# Patient Record
Sex: Male | Born: 2000 | Hispanic: Yes | Marital: Single | Smoking: Never smoker
Health system: Southern US, Community
[De-identification: ages and names within clinical notes are randomized; demographics above are authoritative.]

## PROBLEM LIST (undated history)

## (undated) HISTORY — PX: COLON SURGERY: SHX602

---
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
## 2019-02-20 ENCOUNTER — Encounter: Payer: Self-pay | Admitting: Emergency Medicine

## 2019-02-20 ENCOUNTER — Emergency Department: Payer: Medicaid Other

## 2019-02-20 ENCOUNTER — Emergency Department
Admission: EM | Admit: 2019-02-20 | Discharge: 2019-02-20 | Disposition: A | Discharge status: Home / Self Care | Payer: Medicaid Other | Attending: Emergency Medicine | Admitting: Emergency Medicine

## 2019-02-20 ENCOUNTER — Other Ambulatory Visit: Payer: Self-pay

## 2019-02-20 DIAGNOSIS — R079 Chest pain, unspecified: Secondary | ICD-10-CM | POA: Diagnosis present

## 2019-02-20 LAB — TROPONIN I (HIGH SENSITIVITY): Troponin I (High Sensitivity): <2 ng/L (ref <18)

## 2019-02-20 LAB — CBC
HCT: 42.9 % (ref 39.0–52.0)
Hemoglobin: 14.1 g/dL (ref 13.0–17.0)
MCH: 26.8 pg (ref 26.0–34.0)
MCHC: 32.9 g/dL (ref 30.0–36.0)
MCV: 81.6 fL (ref 80.0–100.0)
Platelets: 222 10*3/uL (ref 150–400)
RBC: 5.26 MIL/uL (ref 4.22–5.81)
RDW: 12.9 % (ref 11.5–15.5)
WBC: 6.8 10*3/uL (ref 4.0–10.5)
nRBC: 0 % (ref 0.0–0.2)

## 2019-02-20 LAB — BASIC METABOLIC PANEL
Anion gap: 9 (ref 5–15)
BUN: 8 mg/dL (ref 6–20)
CO2: 26 mmol/L (ref 22–32)
Calcium: 9.7 mg/dL (ref 8.9–10.3)
Chloride: 104 mmol/L (ref 98–111)
Creatinine, Ser: 0.47 mg/dL — ABNORMAL LOW (ref 0.61–1.24)
GFR calc Af Amer: >60 mL/min (ref >60)
GFR calc non Af Amer: >60 mL/min (ref >60)
Glucose, Bld: 92 mg/dL (ref 70–99)
Potassium: 4 mmol/L (ref 3.5–5.1)
Sodium: 139 mmol/L (ref 135–145)

## 2019-02-20 MED ORDER — FAMOTIDINE 20 MG PO TABS: 20.0000 mg | ORAL_TABLET | Freq: Every day | ORAL | 1 refills | Status: DC

## 2019-02-20 NOTE — ED Triage Notes (Signed)
Patient reports intermittent chest pain "for a while" that is worse with palpation. Patient denies cough or SOB. States 3 episodes this morning.

## 2019-02-20 NOTE — ED Notes (Signed)
Pt ambulatory back from Xray. NAD noted. NoSOB or increased WOB.

## 2019-02-20 NOTE — ED Notes (Signed)
Patient transported to X-ray 

## 2019-02-20 NOTE — Discharge Instructions (Addendum)
Please seek medical attention for any high fevers, chest pain, shortness of breath, change in behavior, persistent vomiting, bloody stool or any other new or concerning symptoms.  

## 2019-02-20 NOTE — ED Provider Notes (Signed)
[facility] Emergency Department Provider Note   ____________________________________________   I have reviewed the triage vital signs and the nursing notes.   HISTORY  Chief Complaint Chest Pain   History limited by: Not Limited   HPI Jeremiah Moran is a 18 y.o. male who presents to the emergency department today because of concerns for chest pain.  He states that he gets this chest pain roughly once a week.  It has been going on for quite some time. He states he has not noticed any pattern to the pain. He describes it as being located in the left and center part of his chest. Is pressure like. Is accompanied by some slight shortness of breath. He says it will last about an hour and then go away.   Records reviewed. Per medical record review patient has a history of being seen in the emergency department roughly 1 year ago for chest wall contusion.  History reviewed. No pertinent past medical history.  There are no active problems to display for this patient.   History reviewed. No pertinent surgical history.  Prior to Admission medications   Not on File    Allergies Patient has no known allergies.  No family history on file.  Social History Social History   Tobacco Use  . Smoking status: Never Smoker  . Smokeless tobacco: Never Used  Substance Use Topics  . Alcohol use: Never    Frequency: Never  . Drug use: Never    Review of Systems Constitutional: No fever/chills Eyes: No visual changes. ENT: No sore throat. Cardiovascular: Positive for chest pain. Respiratory: Positive for shortness of breath. Gastrointestinal: No abdominal pain.  No nausea, no vomiting.  No diarrhea.   Genitourinary: Negative for dysuria. Musculoskeletal: Negative for back pain. Skin: Negative for rash. Neurological: Negative for headaches, focal weakness or numbness.  ____________________________________________   PHYSICAL EXAM:  VITAL SIGNS: ED  Triage Vitals  Enc Vitals Group     BP 02/20/19 1240 (!) 115/56     Pulse Rate 02/20/19 1240 65     Resp 02/20/19 1240 16     Temp 02/20/19 1240 97.9 F (36.6 C)     Temp Source 02/20/19 1240 Oral     SpO2 02/20/19 1240 100 %     Weight 02/20/19 1239 145 lb (65.8 kg)     Height 02/20/19 1239 5\' 7"  (1.702 m)     Head Circumference --      Peak Flow --      Pain Score 02/20/19 1238 2   Constitutional: Alert and oriented.  Eyes: Conjunctivae are normal.  ENT      Head: Normocephalic and atraumatic.      Nose: No congestion/rhinnorhea.      Mouth/Throat: Mucous membranes are moist.      Neck: No stridor. Hematological/Lymphatic/Immunilogical: No cervical lymphadenopathy. Cardiovascular: Normal rate, regular rhythm.  No murmurs, rubs, or gallops. Respiratory: Normal respiratory effort without tachypnea nor retractions. Breath sounds are clear and equal bilaterally. No wheezes/rales/rhonchi. Gastrointestinal: Soft and non tender. No rebound. No guarding.  Genitourinary: Deferred Musculoskeletal: Normal range of motion in all extremities. No lower extremity edema. Neurologic:  Normal speech and language. No gross focal neurologic deficits are appreciated.  Skin:  Skin is warm, dry and intact. No rash noted. Psychiatric: Mood and affect are normal. Speech and behavior are normal. Patient exhibits appropriate insight and judgment.  ____________________________________________    LABS (pertinent positives/negatives)  Trop hs <2 CBC wbc 6.8, hgb 14.1, plt  222 BMP wnl except cr 0.47  ____________________________________________   EKG  I, Nance Pear, attending physician, personally viewed and interpreted this EKG  EKG Time: 1359 Rate: 48 Rhythm: sinus bradycardia Axis: normal Intervals: qtc 351 QRS: narrow ST changes: j point elevation v2, v5, v6 Impression: abnormal ekg  ____________________________________________    RADIOLOGY  CXR No acute  disease  ____________________________________________   PROCEDURES  Procedures  ____________________________________________   INITIAL IMPRESSION / ASSESSMENT AND PLAN / ED COURSE  Pertinent labs & imaging results that were available during my care of the patient were reviewed by me and considered in my medical decision making (see chart for details).   Patient presented to the emergency department today because of concerns for intermittent chest pain.  He does state that the symptoms have been going on for quite some time and will happen roughly once a week.  Patient's blood work without elevated troponin.  Patient's EKG did show some J-point elevation to I think is secondary to repolarization.  Patient chest x-ray without enlarged heart or concerning findings.  Given intermittent nature of the patient's pain I do wonder if patient suffers from heartburn.  I discussed this with the patient.  Will plan on putting patient on an antacid. ____________________________________________   FINAL CLINICAL IMPRESSION(S) / ED DIAGNOSES  Final diagnoses:  Nonspecific chest pain     Note: This dictation was prepared with Dragon dictation. Any transcriptional errors that result from this process are unintentional     Nance Pear, MD 02/20/19 1437

## 2020-12-26 ENCOUNTER — Inpatient Hospital Stay: Payer: Medicaid Other

## 2020-12-26 ENCOUNTER — Emergency Department: Payer: Medicaid Other

## 2020-12-26 ENCOUNTER — Other Ambulatory Visit: Payer: Self-pay

## 2020-12-26 ENCOUNTER — Encounter: Payer: Self-pay | Admitting: Radiology

## 2020-12-26 ENCOUNTER — Observation Stay
Admission: EM | Admit: 2020-12-26 | Discharge: 2020-12-27 | Disposition: A | Discharge status: Home / Self Care | Payer: Medicaid Other | Attending: Student | Admitting: Student

## 2020-12-26 DIAGNOSIS — K56609 Unspecified intestinal obstruction, unspecified as to partial versus complete obstruction: Principal | ICD-10-CM | POA: Insufficient documentation

## 2020-12-26 DIAGNOSIS — Z0189 Encounter for other specified special examinations: Secondary | ICD-10-CM

## 2020-12-26 DIAGNOSIS — D649 Anemia, unspecified: Secondary | ICD-10-CM | POA: Insufficient documentation

## 2020-12-26 DIAGNOSIS — K5651 Intestinal adhesions [bands], with partial obstruction: Principal | ICD-10-CM | POA: Diagnosis present

## 2020-12-26 DIAGNOSIS — Z20822 Contact with and (suspected) exposure to covid-19: Secondary | ICD-10-CM | POA: Insufficient documentation

## 2020-12-26 DIAGNOSIS — E559 Vitamin D deficiency, unspecified: Secondary | ICD-10-CM | POA: Insufficient documentation

## 2020-12-26 DIAGNOSIS — K56600 Partial intestinal obstruction, unspecified as to cause: Secondary | ICD-10-CM | POA: Diagnosis present

## 2020-12-26 DIAGNOSIS — Z9049 Acquired absence of other specified parts of digestive tract: Secondary | ICD-10-CM

## 2020-12-26 DIAGNOSIS — Z79899 Other long term (current) drug therapy: Secondary | ICD-10-CM | POA: Insufficient documentation

## 2020-12-26 DIAGNOSIS — Z9889 Other specified postprocedural states: Secondary | ICD-10-CM

## 2020-12-26 DIAGNOSIS — R1033 Periumbilical pain: Secondary | ICD-10-CM | POA: Diagnosis present

## 2020-12-26 DIAGNOSIS — W3400XS Accidental discharge from unspecified firearms or gun, sequela: Secondary | ICD-10-CM

## 2020-12-26 DIAGNOSIS — F17290 Nicotine dependence, other tobacco product, uncomplicated: Secondary | ICD-10-CM | POA: Insufficient documentation

## 2020-12-26 DIAGNOSIS — D509 Iron deficiency anemia, unspecified: Secondary | ICD-10-CM | POA: Diagnosis present

## 2020-12-26 DIAGNOSIS — R112 Nausea with vomiting, unspecified: Secondary | ICD-10-CM

## 2020-12-26 DIAGNOSIS — E538 Deficiency of other specified B group vitamins: Secondary | ICD-10-CM | POA: Diagnosis present

## 2020-12-26 LAB — COMPREHENSIVE METABOLIC PANEL
ALT: 19 U/L (ref 0–44)
AST: 23 U/L (ref 15–41)
Albumin: 5 g/dL (ref 3.5–5.0)
Alkaline Phosphatase: 97 U/L (ref 38–126)
Anion gap: 9 (ref 5–15)
BUN: 12 mg/dL (ref 6–20)
CO2: 26 mmol/L (ref 22–32)
Calcium: 9.9 mg/dL (ref 8.9–10.3)
Chloride: 104 mmol/L (ref 98–111)
Creatinine, Ser: 0.62 mg/dL (ref 0.61–1.24)
GFR, Estimated: >60 mL/min (ref >60)
Glucose, Bld: 122 mg/dL — ABNORMAL HIGH (ref 70–99)
Potassium: 3.5 mmol/L (ref 3.5–5.1)
Sodium: 139 mmol/L (ref 135–145)
Total Bilirubin: 0.6 mg/dL (ref 0.3–1.2)
Total Protein: 8.4 g/dL — ABNORMAL HIGH (ref 6.5–8.1)

## 2020-12-26 LAB — LIPASE, BLOOD: Lipase: 24 U/L (ref 11–51)

## 2020-12-26 LAB — URINALYSIS, COMPLETE (UACMP) WITH MICROSCOPIC
Bacteria, UA: NONE SEEN
Bilirubin Urine: NEGATIVE
Glucose, UA: NEGATIVE mg/dL
Hgb urine dipstick: NEGATIVE
Ketones, ur: NEGATIVE mg/dL
Leukocytes,Ua: NEGATIVE
Nitrite: NEGATIVE
Protein, ur: NEGATIVE mg/dL
Specific Gravity, Urine: >1.046 — ABNORMAL HIGH (ref 1.005–1.030)
Squamous Epithelial / HPF: NONE SEEN (ref 0–5)
pH: 6 (ref 5.0–8.0)

## 2020-12-26 LAB — CBC
HCT: 39.6 % (ref 39.0–52.0)
Hemoglobin: 13 g/dL (ref 13.0–17.0)
MCH: 25.4 pg — ABNORMAL LOW (ref 26.0–34.0)
MCHC: 32.8 g/dL (ref 30.0–36.0)
MCV: 77.3 fL — ABNORMAL LOW (ref 80.0–100.0)
Platelets: 287 10*3/uL (ref 150–400)
RBC: 5.12 MIL/uL (ref 4.22–5.81)
RDW: 13.7 % (ref 11.5–15.5)
WBC: 8.1 10*3/uL (ref 4.0–10.5)
nRBC: 0 % (ref 0.0–0.2)

## 2020-12-26 LAB — RESP PANEL BY RT-PCR (FLU A&B, COVID) ARPGX2
Influenza A by PCR: NEGATIVE
Influenza B by PCR: NEGATIVE
SARS Coronavirus 2 by RT PCR: NEGATIVE

## 2020-12-26 MED ORDER — SODIUM CHLORIDE 0.9 % IV SOLN: INTRAVENOUS | Status: DC

## 2020-12-26 MED ORDER — ONDANSETRON 4 MG PO TBDP
ORAL_TABLET | ORAL | Status: AC
  Filled 2020-12-26: qty 1

## 2020-12-26 MED ORDER — PANTOPRAZOLE SODIUM 40 MG IV SOLR
40.0000 mg | INTRAVENOUS | Status: DC
  Administered 2020-12-26 – 2020-12-27 (×2): 40 mg via INTRAVENOUS
  Filled 2020-12-26 (×2): qty 40

## 2020-12-26 MED ORDER — ONDANSETRON HCL 4 MG PO TABS: 4.0000 mg | ORAL_TABLET | Freq: Four times a day (QID) | ORAL | Status: DC | PRN

## 2020-12-26 MED ORDER — IOHEXOL 9 MG/ML PO SOLN
500.0000 mL | Freq: Once | ORAL | Status: AC
  Administered 2020-12-26: 500 mL via ORAL

## 2020-12-26 MED ORDER — LACTATED RINGERS IV BOLUS
1000.0000 mL | Freq: Once | INTRAVENOUS | Status: AC
  Administered 2020-12-26: 1000 mL via INTRAVENOUS

## 2020-12-26 MED ORDER — HYDROMORPHONE HCL 1 MG/ML IJ SOLN
1.0000 mg | INTRAMUSCULAR | Status: DC | PRN
  Administered 2020-12-26 – 2020-12-27 (×3): 1 mg via INTRAVENOUS
  Filled 2020-12-26 (×3): qty 1

## 2020-12-26 MED ORDER — IOHEXOL 350 MG/ML SOLN
75.0000 mL | Freq: Once | INTRAVENOUS | Status: AC | PRN
  Administered 2020-12-26: 75 mL via INTRAVENOUS

## 2020-12-26 MED ORDER — ONDANSETRON HCL 4 MG/2ML IJ SOLN
4.0000 mg | Freq: Once | INTRAMUSCULAR | Status: AC
  Administered 2020-12-26: 4 mg via INTRAVENOUS
  Filled 2020-12-26: qty 2

## 2020-12-26 MED ORDER — ONDANSETRON 4 MG PO TBDP
4.0000 mg | ORAL_TABLET | Freq: Once | ORAL | Status: AC
  Administered 2020-12-26: 4 mg via ORAL

## 2020-12-26 MED ORDER — DIATRIZOATE MEGLUMINE & SODIUM 66-10 % PO SOLN
90.0000 mL | Freq: Once | ORAL | Status: AC
  Administered 2020-12-26: 90 mL via NASOGASTRIC

## 2020-12-26 MED ORDER — ONDANSETRON HCL 4 MG/2ML IJ SOLN: 4.0000 mg | Freq: Four times a day (QID) | INTRAMUSCULAR | Status: DC | PRN

## 2020-12-26 MED ORDER — HYDROMORPHONE HCL 1 MG/ML IJ SOLN
0.5000 mg | Freq: Once | INTRAMUSCULAR | Status: AC
  Administered 2020-12-26: 0.5 mg via INTRAVENOUS
  Filled 2020-12-26: qty 1

## 2020-12-26 MED ORDER — MORPHINE SULFATE (PF) 4 MG/ML IV SOLN
4.0000 mg | Freq: Once | INTRAVENOUS | Status: AC
Start: 2020-12-26 — End: 2020-12-26
  Administered 2020-12-26: 4 mg via INTRAVENOUS
  Filled 2020-12-26: qty 1

## 2020-12-26 MED ORDER — METOCLOPRAMIDE HCL 5 MG/ML IJ SOLN
10.0000 mg | Freq: Once | INTRAMUSCULAR | Status: AC
  Administered 2020-12-26: 10 mg via INTRAVENOUS
  Filled 2020-12-26: qty 2
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
## 2020-12-26 NOTE — Progress Notes (Signed)
Patient resting on stretcher quietly. VSS, denies further needs at this time. NG to low-intermittent suction. Family member at bedside and provided with warm blanket.

## 2020-12-26 NOTE — ED Notes (Signed)
Pt tolerated NG tube well, 16 fr. L nare, 50 @ the nare

## 2020-12-26 NOTE — ED Notes (Signed)
Pt given oral contrast and educated by CT tech on use

## 2020-12-26 NOTE — ED Notes (Signed)
Warm blanket provided.
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
## 2020-12-26 NOTE — ED Notes (Signed)
Pt resting comfortably at this time. Normal rise and fall of chest. NAD noted. Grandmother at bedside. Call bell in reach.   Pt educate don need for UA, pt verbalized understanding.

## 2020-12-26 NOTE — ED Notes (Signed)
Suction started at low intermittent suction
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
## 2020-12-26 NOTE — ED Notes (Signed)
XRAY at bedside confirming NG tube placement
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
## 2020-12-26 NOTE — ED Notes (Signed)
MD requested pt go to CT ASAP, CT called, on and states they are on the way to grab him for his scan, MD aware.

## 2020-12-26 NOTE — ED Notes (Signed)
NG tube advanced from 50cm to 54cm per XRAY confirmation

## 2020-12-26 NOTE — ED Notes (Signed)
Pt transported to CT via stretcher.  

## 2020-12-26 NOTE — ED Notes (Signed)
Informed RN bed assigned 

## 2020-12-26 NOTE — ED Notes (Signed)
NG tube clamped after administering 49ml of gastrografin and 1ml of sterile water.  XRAY made aware of admin time of gastrografin

## 2020-12-27 DIAGNOSIS — K56600 Partial intestinal obstruction, unspecified as to cause: Secondary | ICD-10-CM | POA: Diagnosis not present

## 2020-12-27 LAB — VITAMIN B12: Vitamin B-12: 199 pg/mL (ref 180–914)

## 2020-12-27 LAB — BASIC METABOLIC PANEL
Anion gap: 7 (ref 5–15)
BUN: 10 mg/dL (ref 6–20)
CO2: 28 mmol/L (ref 22–32)
Calcium: 9 mg/dL (ref 8.9–10.3)
Chloride: 106 mmol/L (ref 98–111)
Creatinine, Ser: 0.59 mg/dL — ABNORMAL LOW (ref 0.61–1.24)
GFR, Estimated: >60 mL/min (ref >60)
Glucose, Bld: 86 mg/dL (ref 70–99)
Potassium: 3.4 mmol/L — ABNORMAL LOW (ref 3.5–5.1)
Sodium: 141 mmol/L (ref 135–145)

## 2020-12-27 LAB — HIV ANTIBODY (ROUTINE TESTING W REFLEX): HIV Screen 4th Generation wRfx: NONREACTIVE

## 2020-12-27 LAB — VITAMIN D 25 HYDROXY (VIT D DEFICIENCY, FRACTURES): Vit D, 25-Hydroxy: 21.19 ng/mL — ABNORMAL LOW (ref 30–100)

## 2020-12-27 LAB — IRON AND TIBC
Iron: 27 ug/dL — ABNORMAL LOW (ref 45–182)
Saturation Ratios: 6 % — ABNORMAL LOW (ref 17.9–39.5)
TIBC: 494 ug/dL — ABNORMAL HIGH (ref 250–450)
UIBC: 467 ug/dL

## 2020-12-27 LAB — CBC
HCT: 35.8 % — ABNORMAL LOW (ref 39.0–52.0)
Hemoglobin: 11.6 g/dL — ABNORMAL LOW (ref 13.0–17.0)
MCH: 25.3 pg — ABNORMAL LOW (ref 26.0–34.0)
MCHC: 32.4 g/dL (ref 30.0–36.0)
MCV: 78 fL — ABNORMAL LOW (ref 80.0–100.0)
Platelets: 234 10*3/uL (ref 150–400)
RBC: 4.59 MIL/uL (ref 4.22–5.81)
RDW: 13.9 % (ref 11.5–15.5)
WBC: 6.7 10*3/uL (ref 4.0–10.5)
nRBC: 0 % (ref 0.0–0.2)

## 2020-12-27 LAB — MAGNESIUM: Magnesium: 2.1 mg/dL (ref 1.7–2.4)

## 2020-12-27 LAB — FOLATE: Folate: 29 ng/mL (ref >5.9)

## 2020-12-27 LAB — PHOSPHORUS: Phosphorus: 3.7 mg/dL (ref 2.5–4.6)

## 2020-12-27 MED ORDER — POLYSACCHARIDE IRON COMPLEX 150 MG PO CAPS: 150.0000 mg | ORAL_CAPSULE | Freq: Every day | ORAL | 0 refills | Status: AC

## 2020-12-27 MED ORDER — SODIUM CHLORIDE 0.9 % IV SOLN
500.0000 mg | Freq: Once | INTRAVENOUS | Status: AC
  Administered 2020-12-27: 500 mg via INTRAVENOUS
  Filled 2020-12-27: qty 25

## 2020-12-27 MED ORDER — CYANOCOBALAMIN 1000 MCG/ML IJ SOLN
1000.0000 ug | Freq: Once | INTRAMUSCULAR | Status: AC
  Administered 2020-12-27: 1000 ug via INTRAMUSCULAR
  Filled 2020-12-27: qty 1

## 2020-12-27 MED ORDER — CYANOCOBALAMIN 500 MCG PO TABS: 500.0000 ug | ORAL_TABLET | Freq: Every day | ORAL | 0 refills | Status: AC

## 2020-12-27 MED ORDER — VITAMIN D (ERGOCALCIFEROL) 1.25 MG (50000 UNIT) PO CAPS
50000.0000 [IU] | ORAL_CAPSULE | ORAL | Status: DC
  Administered 2020-12-27: 50000 [IU] via ORAL
  Filled 2020-12-27: qty 1

## 2020-12-27 MED ORDER — VITAMIN B-12 1000 MCG PO TABS: 500.0000 ug | ORAL_TABLET | Freq: Every day | ORAL | Status: DC

## 2020-12-27 MED ORDER — POLYSACCHARIDE IRON COMPLEX 150 MG PO CAPS: 150.0000 mg | ORAL_CAPSULE | Freq: Every day | ORAL | Status: DC

## 2020-12-27 MED ORDER — POTASSIUM CHLORIDE CRYS ER 20 MEQ PO TBCR
40.0000 meq | EXTENDED_RELEASE_TABLET | Freq: Once | ORAL | Status: AC
  Administered 2020-12-27: 40 meq via ORAL
  Filled 2020-12-27: qty 2

## 2020-12-27 MED ORDER — VITAMIN D (ERGOCALCIFEROL) 1.25 MG (50000 UNIT) PO CAPS: 50000.0000 [IU] | ORAL_CAPSULE | ORAL | 0 refills | Status: AC

## 2020-12-27 NOTE — Progress Notes (Signed)
Patient ID: Jeremiah Moran, male   DOB: [birth year], 20 y.o.   MRN: [id number]     SURGICAL PROGRESS NOTE   Hospital Day(s): 1.   Interval History: Patient seen and examined, no acute events or new complaints overnight. Patient reports feeling great.  He denies abdominal pain.  He denies any nausea or vomiting.  Vital signs in last 24 hours: [min-max] current  Temp:  [97.8 F (36.6 C)-98.6 F (37 C)] 98 F (36.7 C) (09/03 0809) Pulse Rate:  [45-69] 58 (09/03 0809) Resp:  [16-20] 16 (09/03 0809) BP: (114-127)/(48-81) 124/81 (09/03 0809) SpO2:  [96 %-100 %] 100 % (09/03 0809)     Height: 5\' 8"  (172.7 cm) Weight: 63.5 kg BMI (Calculated): 21.29   Physical Exam:  Constitutional: alert, cooperative and no distress  Respiratory: breathing non-labored at rest  Cardiovascular: regular rate and sinus rhythm  Gastrointestinal: soft, non-tender, and non-distended  Labs:  CBC Latest Ref Rng & Units 12/27/2020 12/26/2020 02/20/2019  WBC 4.0 - 10.5 K/uL 6.7 8.1 6.8  Hemoglobin 13.0 - 17.0 g/dL 11.6(L) 13.0 14.1  Hematocrit 39.0 - 52.0 % 35.8(L) 39.6 42.9  Platelets 150 - 400 K/uL 234 287 222   CMP Latest Ref Rng & Units 12/27/2020 12/26/2020 02/20/2019  Glucose 70 - 99 mg/dL 86 02/22/2019) 92  BUN 6 - 20 mg/dL 10 12 8   Creatinine 0.61 - 1.24 mg/dL 836(O) 2.94(T)  Sodium 135 - 145 mmol/L 141 139 139  Potassium 3.5 - 5.1 mmol/L 3.4(L) 3.5 4.0  Chloride 98 - 111 mmol/L 106 104 104  CO2 22 - 32 mmol/L 28 26 26   Calcium 8.9 - 10.3 mg/dL 9.0 9.9 9.7  Total Protein 6.5 - 8.1 g/dL - 8.4(H) -  Total Bilirubin 0.3 - 1.2 mg/dL - 0.6 -  Alkaline Phos 38 - 126 U/L - 97 -  AST 15 - 41 U/L - 23 -  ALT 0 - 44 U/L - 19 -    Imaging studies: Abdominal x-ray 8 hours after administration of Gastrografin shows contrast in the whole large intestine.  No bowel dilation.  I personally evaluated the images.   Assessment/Plan:  20 y.o. male admitted with with severe abdominal pain.  Today the patient is much  better noted.  Patient without pain.  Patient tolerated full liquids.  As seen on the abdominal x-ray there is no sign of bowel obstruction.  I considered that this patient never had a true bowel obstruction.  Possible etiology of the pain is still having recent exploratory laparotomy.  Patient is tolerating diet.  At this moment again there is no sign of bowel obstruction.  Patient can advance his diet as tolerated.  No further surgical management.  From surgical standpoint patient can be discharged if medically stable.   6.50(P, MD
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
## 2021-03-19 ENCOUNTER — Emergency Department
Admission: EM | Admit: 2021-03-19 | Discharge: 2021-03-19 | Disposition: A | Discharge status: Home / Self Care | Payer: Medicaid Other | Attending: Emergency Medicine | Admitting: Emergency Medicine

## 2021-03-19 ENCOUNTER — Encounter: Payer: Self-pay | Admitting: Emergency Medicine

## 2021-03-19 ENCOUNTER — Other Ambulatory Visit: Payer: Self-pay

## 2021-03-19 DIAGNOSIS — K2900 Acute gastritis without bleeding: Secondary | ICD-10-CM | POA: Insufficient documentation

## 2021-03-19 DIAGNOSIS — R109 Unspecified abdominal pain: Secondary | ICD-10-CM | POA: Diagnosis present

## 2021-03-19 LAB — COMPREHENSIVE METABOLIC PANEL
ALT: 26 U/L (ref 0–44)
AST: 30 U/L (ref 15–41)
Albumin: 4.8 g/dL (ref 3.5–5.0)
Alkaline Phosphatase: 105 U/L (ref 38–126)
Anion gap: 7 (ref 5–15)
BUN: 10 mg/dL (ref 6–20)
CO2: 24 mmol/L (ref 22–32)
Calcium: 9.7 mg/dL (ref 8.9–10.3)
Chloride: 106 mmol/L (ref 98–111)
Creatinine, Ser: 0.65 mg/dL (ref 0.61–1.24)
GFR, Estimated: >60 mL/min (ref >60)
Glucose, Bld: 112 mg/dL — ABNORMAL HIGH (ref 70–99)
Potassium: 3.7 mmol/L (ref 3.5–5.1)
Sodium: 137 mmol/L (ref 135–145)
Total Bilirubin: 0.9 mg/dL (ref 0.3–1.2)
Total Protein: 8.3 g/dL — ABNORMAL HIGH (ref 6.5–8.1)

## 2021-03-19 LAB — URINALYSIS, ROUTINE W REFLEX MICROSCOPIC
Bacteria, UA: NONE SEEN
Bilirubin Urine: NEGATIVE
Glucose, UA: NEGATIVE mg/dL
Hgb urine dipstick: NEGATIVE
Ketones, ur: NEGATIVE mg/dL
Leukocytes,Ua: NEGATIVE
Nitrite: NEGATIVE
Protein, ur: 30 mg/dL — AB
Specific Gravity, Urine: 1.02 (ref 1.005–1.030)
Squamous Epithelial / HPF: NONE SEEN (ref 0–5)
pH: 5 (ref 5.0–8.0)

## 2021-03-19 LAB — CBC
HCT: 45 % (ref 39.0–52.0)
Hemoglobin: 14.2 g/dL (ref 13.0–17.0)
MCH: 24.4 pg — ABNORMAL LOW (ref 26.0–34.0)
MCHC: 31.6 g/dL (ref 30.0–36.0)
MCV: 77.5 fL — ABNORMAL LOW (ref 80.0–100.0)
Platelets: 265 10*3/uL (ref 150–400)
RBC: 5.81 MIL/uL (ref 4.22–5.81)
RDW: 20.1 % — ABNORMAL HIGH (ref 11.5–15.5)
WBC: 6.6 10*3/uL (ref 4.0–10.5)
nRBC: 0 % (ref 0.0–0.2)

## 2021-03-19 LAB — LIPASE, BLOOD: Lipase: 23 U/L (ref 11–51)

## 2021-03-19 MED ORDER — ONDANSETRON 4 MG PO TBDP: 4.0000 mg | ORAL_TABLET | Freq: Three times a day (TID) | ORAL | 0 refills | Status: AC | PRN

## 2021-03-19 MED ORDER — PANTOPRAZOLE SODIUM 40 MG PO TBEC: 40.0000 mg | DELAYED_RELEASE_TABLET | Freq: Every day | ORAL | 0 refills | Status: DC

## 2021-03-19 MED ORDER — LIDOCAINE VISCOUS HCL 2 % MT SOLN
15.0000 mL | Freq: Once | OROMUCOSAL | Status: AC
  Administered 2021-03-19: 15 mL via ORAL
  Filled 2021-03-19: qty 15

## 2021-03-19 MED ORDER — ALUM & MAG HYDROXIDE-SIMETH 200-200-20 MG/5ML PO SUSP
30.0000 mL | Freq: Once | ORAL | Status: AC
  Administered 2021-03-19: 30 mL via ORAL
  Filled 2021-03-19: qty 30
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
## 2021-03-19 NOTE — Discharge Instructions (Addendum)
Your exam and labs are reassuring at this time.  Take the prescription meds as directed.  Follow-up with your primary provider return to the ED if needed.

## 2021-07-01 ENCOUNTER — Ambulatory Visit: Payer: Medicaid Other | Admitting: Nurse Practitioner

## 2021-07-01 ENCOUNTER — Encounter: Payer: Self-pay | Admitting: Nurse Practitioner

## 2021-07-01 ENCOUNTER — Other Ambulatory Visit: Payer: Self-pay

## 2021-07-01 DIAGNOSIS — Z113 Encounter for screening for infections with a predominantly sexual mode of transmission: Secondary | ICD-10-CM | POA: Diagnosis not present

## 2021-07-01 LAB — GRAM STAIN

## 2021-07-01 NOTE — Progress Notes (Signed)
Gram stain reviewed, no treatment indicated..Loria Lacina Brewer-Jensen, RN  ?

## 2021-07-02 NOTE — Progress Notes (Signed)
[facility] Department ?STI clinic/screening visit ? ?Subjective:  ?Jeremiah Moran is a 21 y.o. male being seen today for an STI screening visit. The patient reports they do have symptoms.   ? ?Patient has the following medical conditions:   ?Patient Active Problem List  ? Diagnosis Date Noted  ? Partial small bowel obstruction (HCC) 12/26/2020  ? ? ? ?Chief Complaint  ?Patient presents with  ? SEXUALLY TRANSMITTED DISEASE  ? ? ?HPI ? ?Patient reports to clinic today for a STD screening.  Patient reports some penile itching/irritation and lesion for one month.   ? ?Does the patient or their partner desires a pregnancy in the next year? No ? ?Screening for MPX risk: ?Does the patient have an unexplained rash? No ?Is the patient MSM? No ?Does the patient endorse multiple sex partners or anonymous sex partners? No ?Did the patient have close or sexual contact with a person diagnosed with MPX? No ?Has the patient traveled outside the [place] where MPX is endemic? No ?Is there a high clinical suspicion for MPX-- evidenced by one of the following No ? -Unlikely to be chickenpox ? -Lymphadenopathy ? -Rash that present in same phase of evolution on any given body part ? ? ?See flowsheet for further details and programmatic requirements.  ? ? ?The following portions of the patient's history were reviewed and updated as appropriate: allergies, current medications, past medical history, past social history, past surgical history and problem list. ? ?Objective:  ?There were no vitals filed for this visit. ? ?Physical Exam ?Constitutional:   ?   Appearance: Normal appearance.  ?HENT:  ?   Head: Normocephalic.  ?   Right Ear: External ear normal.  ?   Left Ear: External ear normal.  ?   Nose: Nose normal.  ?   Mouth/Throat:  ?   Mouth: Mucous membranes are moist.  ?   Comments: Braces, no visible signs of dental caries.  ?Pulmonary:  ?   Effort: Pulmonary effort is normal.  ?Abdominal:  ?   General: Abdomen is flat.   ?   Palpations: Abdomen is soft.  ?Genitourinary: ?   Penis: Uncircumcised.   ?   Comments: Pubic area without nits, lice, hair loss, edema, erythema, lesions and inguinal adenopathy. ?Penis without lesions and discharge at meatus.  Redness and irritation noted around foreskin of the penis.  ?Testicles descended bilaterally,nt, no masses or edema.  ?Musculoskeletal:  ?   Cervical back: Full passive range of motion without pain, normal range of motion and neck supple.  ?Skin: ?   General: Skin is warm and dry.  ?Neurological:  ?   Mental Status: He is alert and oriented to person, place, and time.  ?Psychiatric:     ?   Attention and Perception: Attention normal.     ?   Mood and Affect: Mood normal.     ?   Speech: Speech normal.     ?   Behavior: Behavior is cooperative.  ? ? ? ? ?Assessment and Plan:  ?Jeremiah Moran is a 21 y.o. male presenting to the [facility] Department for STI screening ? ?1. Screening examination for venereal disease ?-21 year old male in clinic today for STD screening. ?-Patient does have STI symptoms ?Patient accepted all screenings including  oral GC and bloodwork for HIV/RPR.  ?Patient meets criteria for HepB screening? Yes. Ordered? No - patient refused ?Patient meets criteria for HepC screening? Yes. Ordered? No - patient refused ?Recommended  condom use with all sex ?Discussed importance of condom use for STI prevent ? ?Treat gram stain per standing order ?Discussed time line for [facility] results and that patient will be called with positive results and encouraged patient to call if he had not heard in 2 weeks ?Recommended returning for continued or worsening symptoms.   ? ?-Irritation and itching noted to foreskin of the penis.  Recommended anti-fungal cream to site.  Also recommended keeping foreskin around penis dry and clean.   ? ?- HIV [facility] ?- Syphilis Serology, [facility] ?- Gonococcus culture ?- Gram stain ? ? ? ? ?Return if symptoms worsen or fail  to improve. ? ? ? ?Glenna Fellows, FNP ? ?

## 2021-07-05 LAB — GONOCOCCUS CULTURE

## 2021-08-15 ENCOUNTER — Other Ambulatory Visit: Payer: Self-pay

## 2021-08-15 ENCOUNTER — Emergency Department: Payer: Medicaid Other

## 2021-08-15 ENCOUNTER — Emergency Department
Admission: EM | Admit: 2021-08-15 | Discharge: 2021-08-15 | Disposition: A | Discharge status: Home / Self Care | Payer: Medicaid Other | Attending: Emergency Medicine | Admitting: Emergency Medicine

## 2021-08-15 DIAGNOSIS — R109 Unspecified abdominal pain: Secondary | ICD-10-CM | POA: Diagnosis present

## 2021-08-15 DIAGNOSIS — R252 Cramp and spasm: Secondary | ICD-10-CM

## 2021-08-15 LAB — COMPREHENSIVE METABOLIC PANEL
ALT: 24 U/L (ref 0–44)
AST: 25 U/L (ref 15–41)
Albumin: 4.7 g/dL (ref 3.5–5.0)
Alkaline Phosphatase: 85 U/L (ref 38–126)
Anion gap: 8 (ref 5–15)
BUN: 10 mg/dL (ref 6–20)
CO2: 26 mmol/L (ref 22–32)
Calcium: 9.4 mg/dL (ref 8.9–10.3)
Chloride: 108 mmol/L (ref 98–111)
Creatinine, Ser: 0.61 mg/dL (ref 0.61–1.24)
GFR, Estimated: >60 mL/min (ref >60)
Glucose, Bld: 84 mg/dL (ref 70–99)
Potassium: 3.5 mmol/L (ref 3.5–5.1)
Sodium: 142 mmol/L (ref 135–145)
Total Bilirubin: 0.6 mg/dL (ref 0.3–1.2)
Total Protein: 7.7 g/dL (ref 6.5–8.1)

## 2021-08-15 LAB — URINALYSIS, ROUTINE W REFLEX MICROSCOPIC
Bilirubin Urine: NEGATIVE
Glucose, UA: NEGATIVE mg/dL
Hgb urine dipstick: NEGATIVE
Ketones, ur: NEGATIVE mg/dL
Leukocytes,Ua: NEGATIVE
Nitrite: NEGATIVE
Protein, ur: NEGATIVE mg/dL
Specific Gravity, Urine: 1.024 (ref 1.005–1.030)
pH: 7 (ref 5.0–8.0)

## 2021-08-15 LAB — LIPASE, BLOOD: Lipase: 32 U/L (ref 11–51)

## 2021-08-15 LAB — CBC
HCT: 42.9 % (ref 39.0–52.0)
Hemoglobin: 13.9 g/dL (ref 13.0–17.0)
MCH: 26.8 pg (ref 26.0–34.0)
MCHC: 32.4 g/dL (ref 30.0–36.0)
MCV: 82.7 fL (ref 80.0–100.0)
Platelets: 247 10*3/uL (ref 150–400)
RBC: 5.19 MIL/uL (ref 4.22–5.81)
RDW: 13.7 % (ref 11.5–15.5)
WBC: 10.2 10*3/uL (ref 4.0–10.5)
nRBC: 0 % (ref 0.0–0.2)

## 2021-08-15 MED ORDER — SUCRALFATE 1 G PO TABS: 1.0000 g | ORAL_TABLET | Freq: Four times a day (QID) | ORAL | 0 refills | Status: AC

## 2021-08-15 MED ORDER — ALUM & MAG HYDROXIDE-SIMETH 200-200-20 MG/5ML PO SUSP
30.0000 mL | Freq: Once | ORAL | Status: AC
  Administered 2021-08-15: 30 mL via ORAL
  Filled 2021-08-15: qty 30

## 2021-08-15 MED ORDER — LIDOCAINE VISCOUS HCL 2 % MT SOLN
15.0000 mL | Freq: Once | OROMUCOSAL | Status: AC
  Administered 2021-08-15: 15 mL via ORAL
  Filled 2021-08-15: qty 15

## 2021-08-15 MED ORDER — FAMOTIDINE 20 MG PO TABS: 20.0000 mg | ORAL_TABLET | Freq: Every day | ORAL | 1 refills | Status: DC

## 2021-08-15 NOTE — ED Provider Notes (Signed)
? ?[facility] ?Provider Note ? ? ? Event Date/Time  ? First MD Initiated Contact with Patient 08/15/21 1842   ?  (approximate) ? ? ?History  ? ?Abdominal Pain ? ? ?HPI ? ?Jeremiah Moran is a 21 y.o. male  who, per discharge summary dated 12/27/20 had admission secondary to small bowel obstruction, who presents to the emergency department today because of concerns for abdominal and chest pain.  Patient states that it started a few days ago.  Initially the pain was located primarily in his upper abdomen.  However it is now located more in his left chest.  He did have some vomiting yesterday but thinks it was related to alcohol use.  Patient denies any change in his bowel movements.  He thinks he did have one today.  He has been passing gas today.  Patient also states that an episode where his all muscles cramped and tightened.  This lasted maybe 5 to 10 minutes. ? ?Physical Exam  ? ?Triage Vital Signs: ?ED Triage Vitals  ?Enc Vitals Group  ?   BP 08/15/21 1838 (!) 146/94  ?   Pulse Rate 08/15/21 1838 81  ?   Resp 08/15/21 1838 18  ?   Temp 08/15/21 1838 98.4 ?F (36.9 ?C)  ?   Temp src --   ?   SpO2 08/15/21 1838 100 %  ?   Weight --   ?   Height --   ?   Head Circumference --   ?   Peak Flow --   ?   Pain Score 08/15/21 1837 0  ? ?Most recent vital signs: ?Vitals:  ? 08/15/21 1838  ?BP: (!) 146/94  ?Pulse: 81  ?Resp: 18  ?Temp: 98.4 ?F (36.9 ?C)  ?SpO2: 100%  ? ? ?General: Awake, alert and oriented. ?CV:  Good peripheral perfusion. Regular rate and rhythm. ?Resp:  Normal effort. Lungs clear. ?Abd:  No distention. Non tender. ? ? ?ED Results / Procedures / Treatments  ? ?Labs ?(all labs ordered are listed, but only abnormal results are displayed) ?Labs Reviewed  ?LIPASE, BLOOD  ?COMPREHENSIVE METABOLIC PANEL  ?CBC  ?URINALYSIS, ROUTINE W REFLEX MICROSCOPIC  ? ? ? ?EKG ? ?IPhineas Semen, attending physician, personally viewed and interpreted this EKG ? ?EKG Time: 1849 ?Rate: 83 ?Rhythm:  sinus rhythm ?Axis: normal ?Intervals: qtc 398 ?QRS: narrow ?ST changes: no st elevation ?Impression: abnormal ekg ? ?RADIOLOGY ? ?I independently interpreted and visualized the acute abdomen series. My interpretation: No acute abnormality ?Radiology interpretation:  ?   ?IMPRESSION:  ?Negative abdominal radiographs.  No acute cardiopulmonary disease.  ? ? ? ?PROCEDURES: ? ?Critical Care performed: No ? ?Procedures ? ? ?MEDICATIONS ORDERED IN ED: ?Medications - No data to display ? ? ?IMPRESSION / MDM / ASSESSMENT AND PLAN / ED COURSE  ?I reviewed the triage vital signs and the nursing notes. ?             ?               ? ?Differential diagnosis includes, but is not limited to, acid reflux, SBO, electrolyte abnormality. ? ?Patient presented to the emergency department today with concerns for abdominal and chest discomfort as well as 1 episode of muscle cramping.  On exam patient states he still has some chest discomfort.  No further muscle cramping.  Work-up here without any concerning electrolyte abnormality.  I did obtain abdominal and chest x-rays which did not show any concerning free  air or signs of obstruction.  Patient states he did feel somewhat better after GI cocktail.  I certainly think that at least the abdominal chest pain is related to acid reflux.  Patient unclear etiology of the muscle cramping although reassuring blood work.  Given that patient clinically feels better and reassuring work-up I do not feel he necessitates inpatient admission at this time.  Will discharge with antacid and dietary guidelines.  Encouraged primary care follow-up. ? ?FINAL CLINICAL IMPRESSION(S) / ED DIAGNOSES  ? ?Final diagnoses:  ?Abdominal pain, unspecified abdominal location  ?Muscle cramp  ? ? ? ?Note:  This document was prepared using Dragon voice recognition software and may include unintentional dictation errors. ? ?  ?Phineas Semen, MD ?08/15/21 2102 ? ?
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
## 2021-08-15 NOTE — Discharge Instructions (Signed)
Please seek medical attention for any high fevers, chest pain, shortness of breath, change in behavior, persistent vomiting, bloody stool or any other new or concerning symptoms.  
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
## 2021-08-15 NOTE — ED Notes (Signed)
Pt NAD, a/ox4. Pt verbalizes understanding of all DC and f/u instructions. All questions answered. Pt walks with steady gait to lobby at DC.  ? ?

## 2022-02-04 ENCOUNTER — Emergency Department: Payer: Medicaid Other

## 2022-02-04 ENCOUNTER — Emergency Department
Admission: EM | Admit: 2022-02-04 | Discharge: 2022-02-04 | Disposition: A | Discharge status: Home / Self Care | Payer: Medicaid Other | Attending: Emergency Medicine | Admitting: Emergency Medicine

## 2022-02-04 ENCOUNTER — Other Ambulatory Visit: Payer: Self-pay

## 2022-02-04 DIAGNOSIS — R1032 Left lower quadrant pain: Secondary | ICD-10-CM | POA: Diagnosis present

## 2022-02-04 LAB — COMPREHENSIVE METABOLIC PANEL
ALT: 15 U/L (ref 0–44)
AST: 20 U/L (ref 15–41)
Albumin: 5.1 g/dL — ABNORMAL HIGH (ref 3.5–5.0)
Alkaline Phosphatase: 67 U/L (ref 38–126)
Anion gap: 9 (ref 5–15)
BUN: 11 mg/dL (ref 6–20)
CO2: 24 mmol/L (ref 22–32)
Calcium: 10.2 mg/dL (ref 8.9–10.3)
Chloride: 107 mmol/L (ref 98–111)
Creatinine, Ser: 0.56 mg/dL — ABNORMAL LOW (ref 0.61–1.24)
GFR, Estimated: >60 mL/min (ref >60)
Glucose, Bld: 92 mg/dL (ref 70–99)
Potassium: 3.9 mmol/L (ref 3.5–5.1)
Sodium: 140 mmol/L (ref 135–145)
Total Bilirubin: 0.8 mg/dL (ref 0.3–1.2)
Total Protein: 8.3 g/dL — ABNORMAL HIGH (ref 6.5–8.1)

## 2022-02-04 LAB — CBC
HCT: 44.1 % (ref 39.0–52.0)
Hemoglobin: 14.4 g/dL (ref 13.0–17.0)
MCH: 27 pg (ref 26.0–34.0)
MCHC: 32.7 g/dL (ref 30.0–36.0)
MCV: 82.7 fL (ref 80.0–100.0)
Platelets: 234 10*3/uL (ref 150–400)
RBC: 5.33 MIL/uL (ref 4.22–5.81)
RDW: 13.2 % (ref 11.5–15.5)
WBC: 6.6 10*3/uL (ref 4.0–10.5)
nRBC: 0 % (ref 0.0–0.2)

## 2022-02-04 LAB — URINALYSIS, ROUTINE W REFLEX MICROSCOPIC
Bilirubin Urine: NEGATIVE
Glucose, UA: NEGATIVE mg/dL
Hgb urine dipstick: NEGATIVE
Ketones, ur: NEGATIVE mg/dL
Leukocytes,Ua: NEGATIVE
Nitrite: NEGATIVE
Protein, ur: NEGATIVE mg/dL
Specific Gravity, Urine: 1.02 (ref 1.005–1.030)
pH: 6 (ref 5.0–8.0)

## 2022-02-04 LAB — LIPASE, BLOOD: Lipase: 26 U/L (ref 11–51)

## 2022-02-04 MED ORDER — ACETAMINOPHEN 325 MG PO TABS
650.0000 mg | ORAL_TABLET | Freq: Once | ORAL | Status: AC
  Administered 2022-02-04: 650 mg via ORAL
  Filled 2022-02-04: qty 2

## 2022-02-04 MED ORDER — IOHEXOL 300 MG/ML  SOLN
100.0000 mL | Freq: Once | INTRAMUSCULAR | Status: AC | PRN
  Administered 2022-02-04: 100 mL via INTRAVENOUS

## 2022-02-04 MED ORDER — DICYCLOMINE HCL 10 MG PO CAPS
10.0000 mg | ORAL_CAPSULE | Freq: Once | ORAL | Status: AC
  Administered 2022-02-04: 10 mg via ORAL
  Filled 2022-02-04: qty 1

## 2022-02-04 MED ORDER — PANTOPRAZOLE SODIUM 40 MG PO TBEC: 40.0000 mg | DELAYED_RELEASE_TABLET | Freq: Every day | ORAL | 3 refills | Status: AC

## 2022-02-04 MED ORDER — DICYCLOMINE HCL 10 MG PO CAPS: 10.0000 mg | ORAL_CAPSULE | Freq: Three times a day (TID) | ORAL | 2 refills | Status: AC

## 2022-02-04 NOTE — ED Notes (Signed)
Pt c/o headache of 6/10 on 0-10 pain scale. Tylenol given. See MAR.
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
## 2022-02-04 NOTE — ED Notes (Signed)
Pt verbalized understanding of discharge instructions, prescriptions, and follow-up care instructions. Pt advised if symptoms worsen to return to ED. Pt ambulatory with steady gait upon discharge.

## 2022-02-04 NOTE — Discharge Instructions (Addendum)
Your exam, labs, and CT are normal and reassuring at this time.  No indication for your left lower quadrant pain.  Also no evidence of any acute inflammatory process, appendicitis, or colitis.  You should take the prescription Protonix and dicyclomine as prescribed or as needed.  Follow-up with GI medicine for ongoing evaluation and care.

## 2022-02-04 NOTE — ED Notes (Signed)
Pt ambulatory to bathroom with steady gait at this time .
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
## 2022-07-06 IMAGING — DX DG ABDOMEN 1V
1 series · 1 of 1 positions shown · non-contrast
Comparison: Portable exam 1288 hours without priors for comparison

CLINICAL DATA: Nasogastric tube placement

EXAM:
ABDOMEN - 1 VIEW

[abdomen supine]
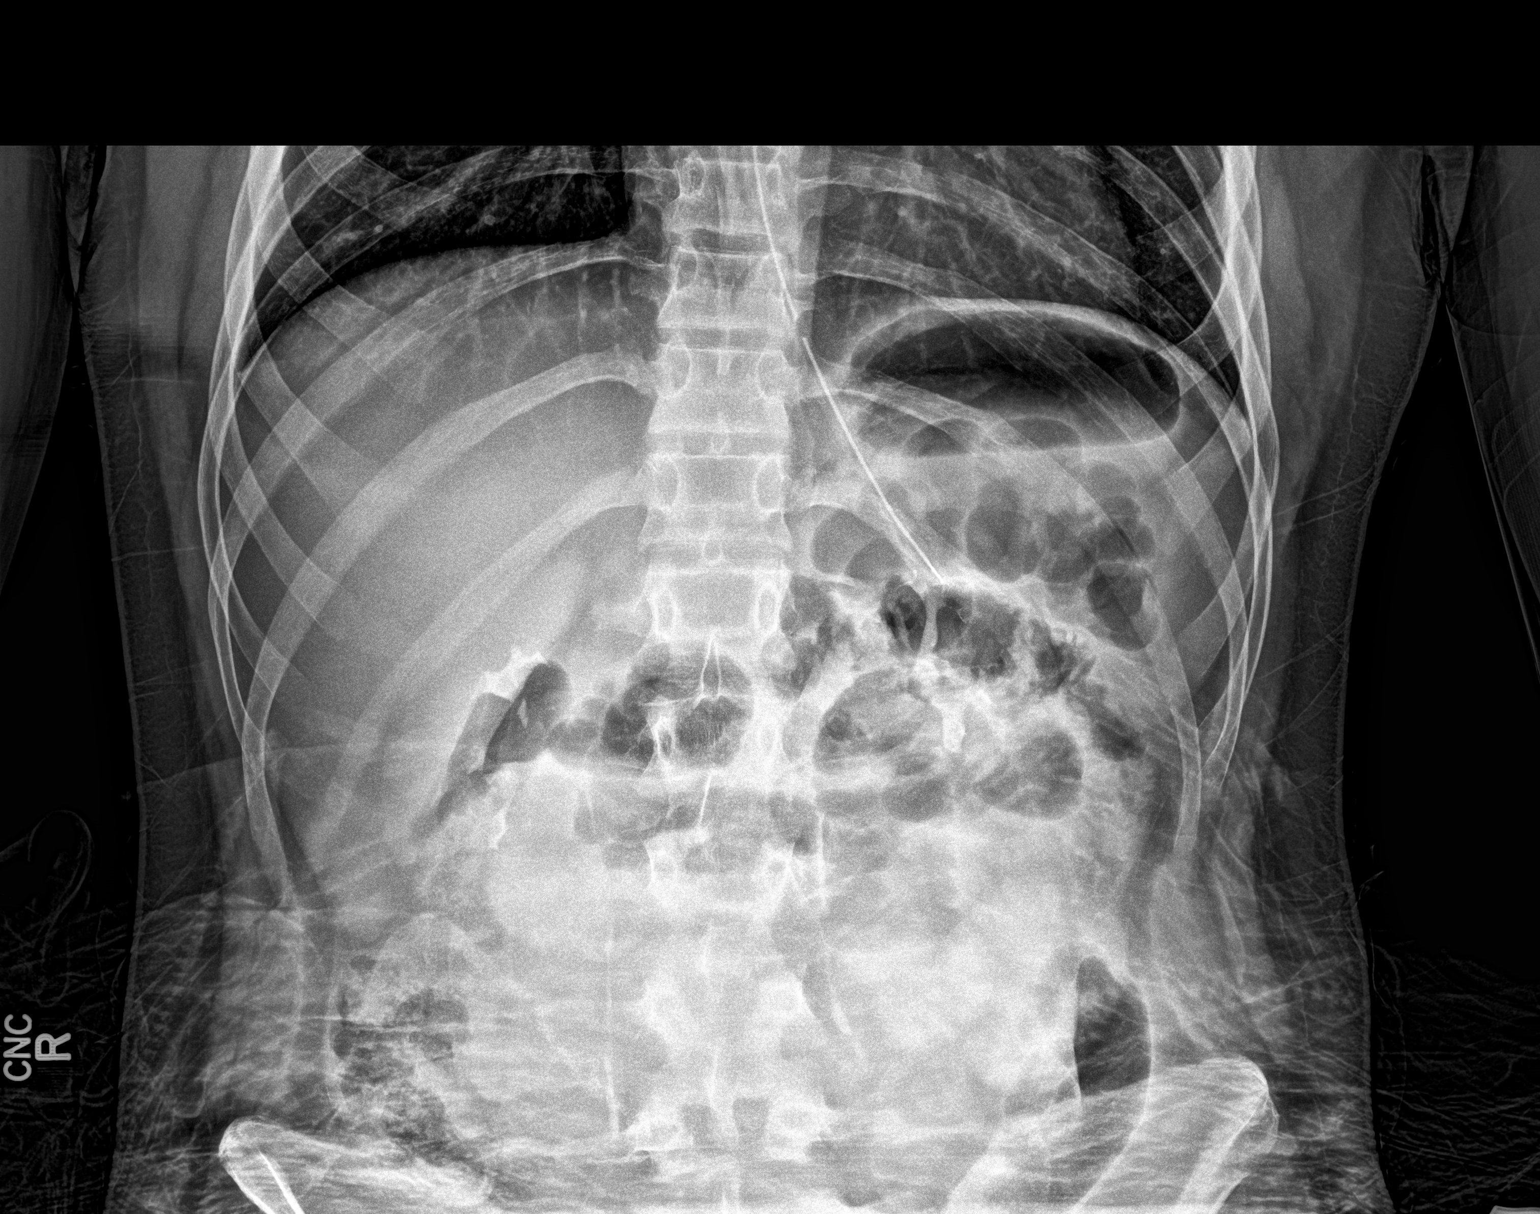

[1 of 1 positions shown; findings below may reference images not displayed]

FINDINGS: Tip of nasogastric tube projects over stomach though the proximal
side-port projects over the distal esophagus; recommend advancing
tube 4 cm.

Lung bases clear.

Nonobstructive bowel gas pattern.

Osseous structures unremarkable.
IMPRESSION: Recommend advancing nasogastric tube 4 cm.

## 2022-07-06 IMAGING — DX DG ABD PORTABLE 1V
2 series · 2 of 2 positions shown · non-contrast
Comparison: None.

CLINICAL DATA: 8 hour delay

EXAM:
PORTABLE ABDOMEN - 1 VIEW

[abdomen supine (1 of 2)]
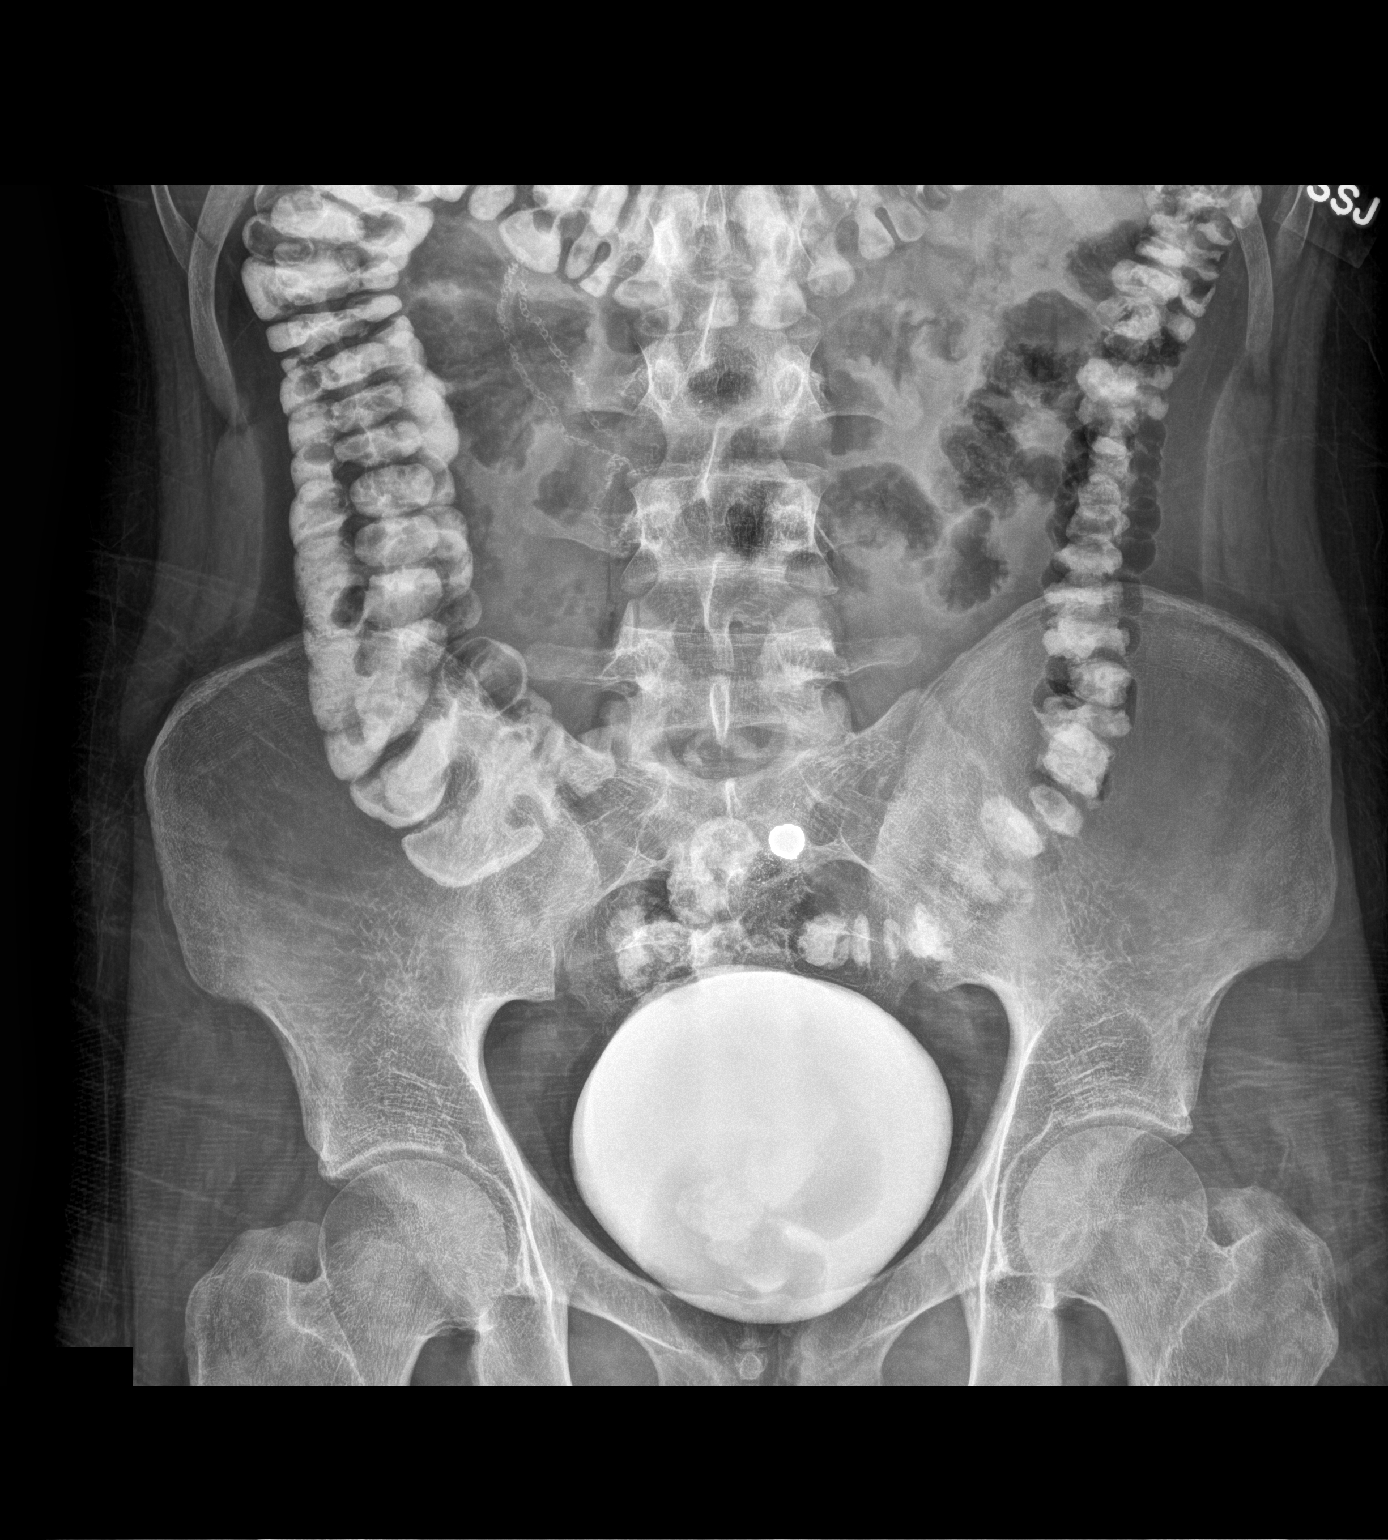

[abdomen supine (2 of 2)]
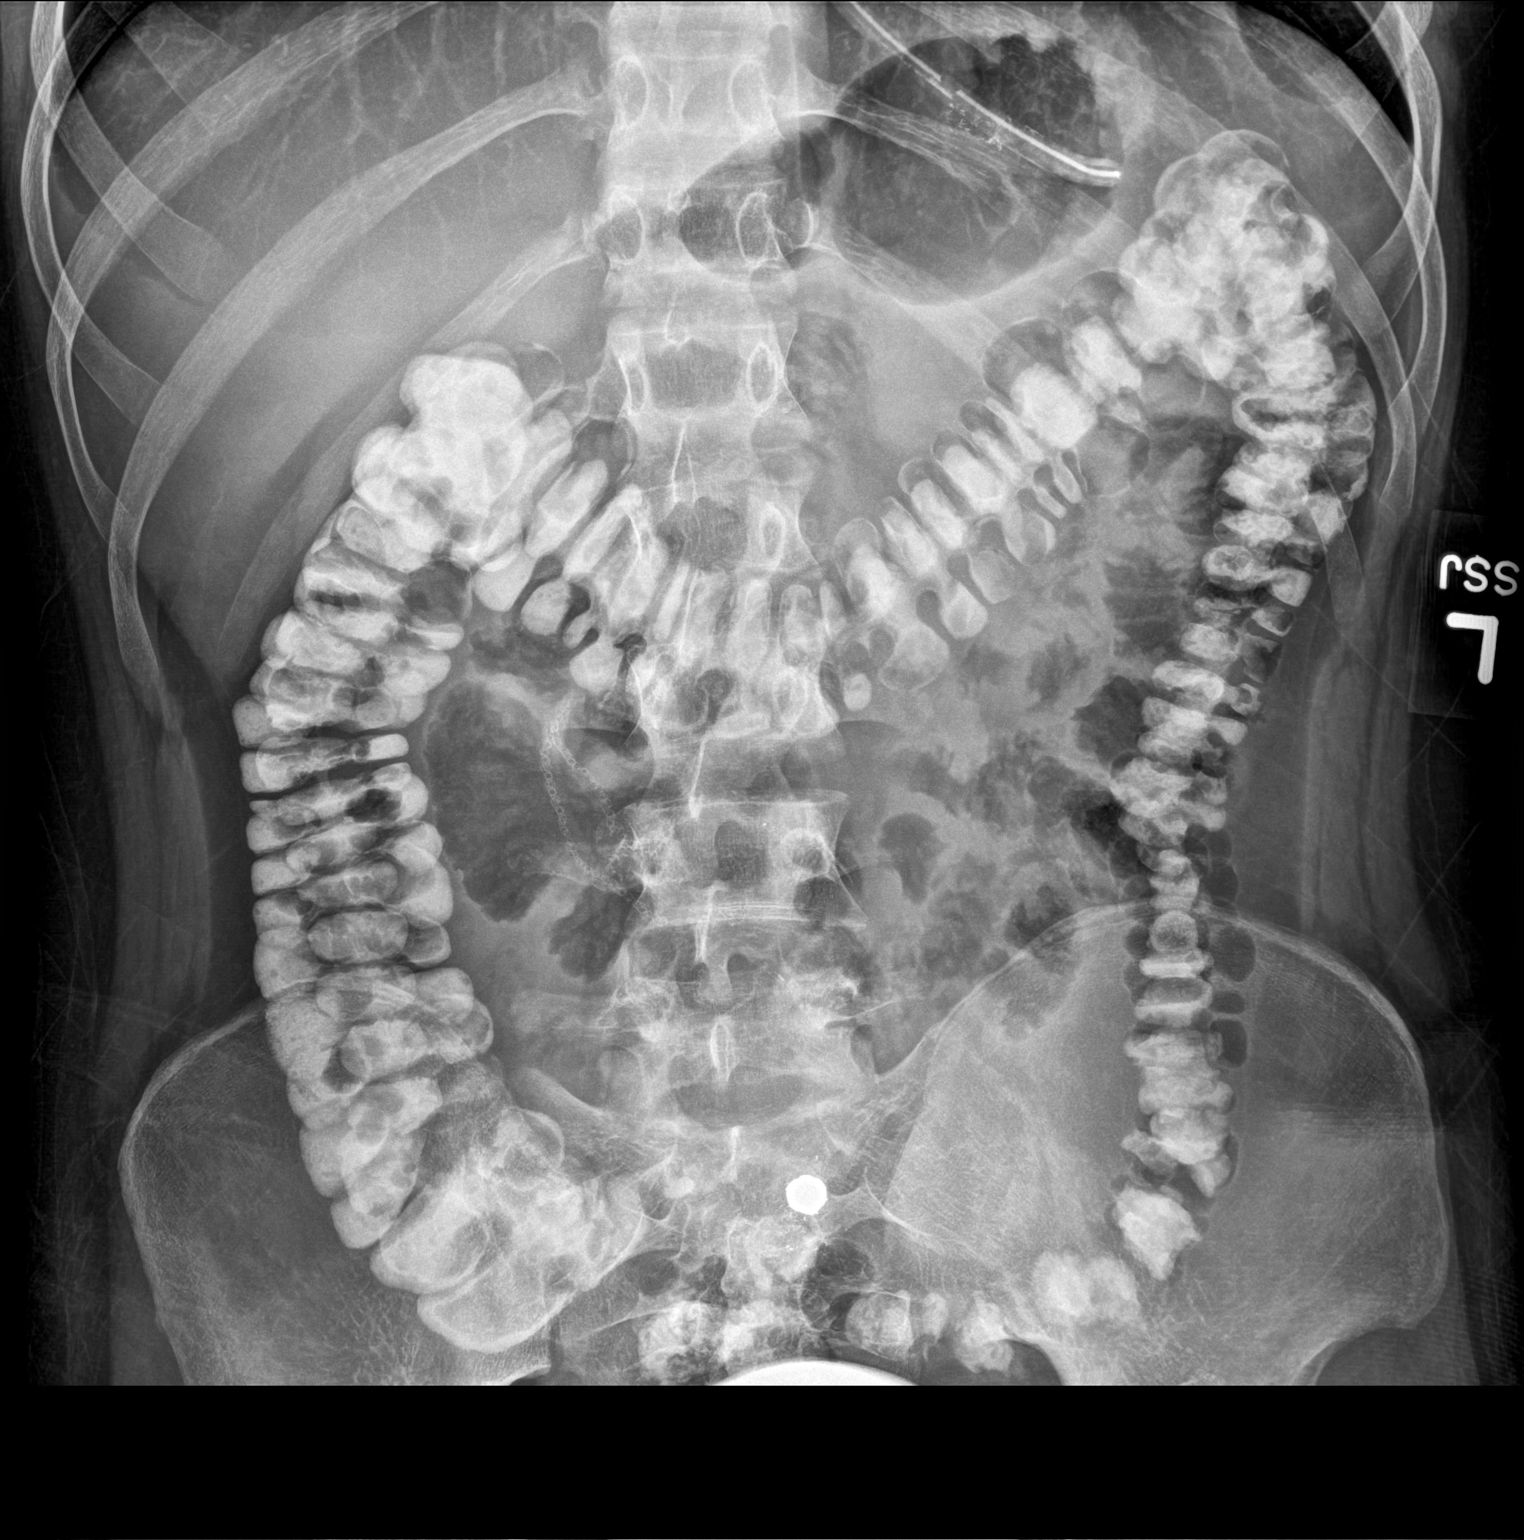

[2 of 2 positions shown; findings below may reference images not displayed]

FINDINGS: There has been interval progression of contrast into the colon.
There are mildly prominent loops of small bowel in the mid abdomen.
There are postsurgical changes noted. There is contrast material
within the bladder.
IMPRESSION: Interval progression of contrast into the colon suggesting there is
no bowel obstruction. Mildly prominent small bowel loops in the
central abdomen.
